# Patient Record
Sex: Male | Born: 1990 | Race: Black or African American | Hispanic: No | Marital: Married | Smoking: Current every day smoker
Health system: Southern US, Community
[De-identification: ages and names within clinical notes are randomized; demographics above are authoritative.]

## PROBLEM LIST (undated history)

## (undated) DIAGNOSIS — G4730 Sleep apnea, unspecified: Secondary | ICD-10-CM

## (undated) DIAGNOSIS — I10 Essential (primary) hypertension: Secondary | ICD-10-CM

## (undated) DIAGNOSIS — E669 Obesity, unspecified: Secondary | ICD-10-CM

## (undated) DIAGNOSIS — J45909 Unspecified asthma, uncomplicated: Secondary | ICD-10-CM

## (undated) HISTORY — DX: Essential (primary) hypertension: I10

## (undated) HISTORY — DX: Obesity, unspecified: E66.9

## (undated) HISTORY — DX: Sleep apnea, unspecified: G47.30

---
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
## 2016-04-25 ENCOUNTER — Emergency Department: Payer: No Typology Code available for payment source

## 2016-04-25 ENCOUNTER — Emergency Department
Admission: EM | Admit: 2016-04-25 | Discharge: 2016-04-25 | Disposition: A | Discharge status: Home / Self Care | Payer: No Typology Code available for payment source | Attending: Emergency Medicine | Admitting: Emergency Medicine

## 2016-04-25 DIAGNOSIS — S161XXA Strain of muscle, fascia and tendon at neck level, initial encounter: Secondary | ICD-10-CM | POA: Insufficient documentation

## 2016-04-25 DIAGNOSIS — Y999 Unspecified external cause status: Secondary | ICD-10-CM | POA: Insufficient documentation

## 2016-04-25 DIAGNOSIS — J45909 Unspecified asthma, uncomplicated: Secondary | ICD-10-CM | POA: Insufficient documentation

## 2016-04-25 DIAGNOSIS — Y9389 Activity, other specified: Secondary | ICD-10-CM | POA: Diagnosis not present

## 2016-04-25 DIAGNOSIS — Y92410 Unspecified street and highway as the place of occurrence of the external cause: Secondary | ICD-10-CM | POA: Insufficient documentation

## 2016-04-25 DIAGNOSIS — M542 Cervicalgia: Secondary | ICD-10-CM | POA: Diagnosis present

## 2016-04-25 DIAGNOSIS — F17210 Nicotine dependence, cigarettes, uncomplicated: Secondary | ICD-10-CM | POA: Insufficient documentation

## 2016-04-25 HISTORY — DX: Unspecified asthma, uncomplicated: J45.909

## 2016-04-25 MED ORDER — METHOCARBAMOL 750 MG PO TABS: 750.0000 mg | ORAL_TABLET | Freq: Four times a day (QID) | ORAL | Status: DC

## 2016-04-25 MED ORDER — IBUPROFEN 50 MG PO CHEW: 50.0000 mg | CHEWABLE_TABLET | Freq: Three times a day (TID) | ORAL | Status: AC | PRN

## 2016-04-25 MED ORDER — IBUPROFEN 600 MG PO TABS: 600.0000 mg | ORAL_TABLET | Freq: Three times a day (TID) | ORAL | Status: DC | PRN

## 2016-04-25 MED ORDER — TRAMADOL HCL 50 MG PO TABS: 50.0000 mg | ORAL_TABLET | Freq: Four times a day (QID) | ORAL | Status: AC | PRN
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
## 2016-04-25 NOTE — Discharge Instructions (Signed)

## 2018-01-13 DIAGNOSIS — J45909 Unspecified asthma, uncomplicated: Secondary | ICD-10-CM | POA: Insufficient documentation

## 2018-01-13 DIAGNOSIS — F17210 Nicotine dependence, cigarettes, uncomplicated: Secondary | ICD-10-CM | POA: Insufficient documentation

## 2018-01-13 DIAGNOSIS — M25531 Pain in right wrist: Secondary | ICD-10-CM | POA: Insufficient documentation

## 2018-01-13 DIAGNOSIS — M25532 Pain in left wrist: Secondary | ICD-10-CM | POA: Insufficient documentation
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
## 2018-01-14 ENCOUNTER — Emergency Department
Admission: EM | Admit: 2018-01-14 | Discharge: 2018-01-14 | Disposition: A | Discharge status: Home / Self Care | Payer: Self-pay | Attending: Emergency Medicine | Admitting: Emergency Medicine

## 2018-01-14 ENCOUNTER — Emergency Department: Payer: Self-pay

## 2018-01-14 DIAGNOSIS — M25531 Pain in right wrist: Secondary | ICD-10-CM

## 2018-01-14 DIAGNOSIS — M25532 Pain in left wrist: Secondary | ICD-10-CM

## 2018-01-14 NOTE — ED Notes (Signed)
Pt in bed, no complaints at this time.
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
## 2020-01-02 ENCOUNTER — Ambulatory Visit: Payer: Self-pay | Attending: Internal Medicine

## 2020-01-02 DIAGNOSIS — Z20822 Contact with and (suspected) exposure to covid-19: Secondary | ICD-10-CM | POA: Insufficient documentation

## 2020-01-03 LAB — SARS-COV-2, NAA 2 DAY TAT

## 2020-01-03 LAB — NOVEL CORONAVIRUS, NAA: SARS-CoV-2, NAA: NOT DETECTED

## 2020-02-25 ENCOUNTER — Ambulatory Visit
Admission: EM | Admit: 2020-02-25 | Discharge: 2020-02-25 | Disposition: A | Discharge status: Home / Self Care | Payer: Self-pay | Attending: Urgent Care | Admitting: Urgent Care

## 2020-02-25 ENCOUNTER — Other Ambulatory Visit: Payer: Self-pay

## 2020-02-25 ENCOUNTER — Encounter: Payer: Self-pay | Admitting: Emergency Medicine

## 2020-02-25 DIAGNOSIS — I10 Essential (primary) hypertension: Secondary | ICD-10-CM

## 2020-02-25 LAB — CBC
HCT: 42.2 % (ref 39.0–52.0)
Hemoglobin: 13.7 g/dL (ref 13.0–17.0)
MCH: 29 pg (ref 26.0–34.0)
MCHC: 32.5 g/dL (ref 30.0–36.0)
MCV: 89.4 fL (ref 80.0–100.0)
Platelets: 348 10*3/uL (ref 150–400)
RBC: 4.72 MIL/uL (ref 4.22–5.81)
RDW: 13.1 % (ref 11.5–15.5)
WBC: 5 10*3/uL (ref 4.0–10.5)
nRBC: 0 % (ref 0.0–0.2)

## 2020-02-25 LAB — BASIC METABOLIC PANEL
Anion gap: 8 (ref 5–15)
BUN: 12 mg/dL (ref 6–20)
CO2: 26 mmol/L (ref 22–32)
Calcium: 9 mg/dL (ref 8.9–10.3)
Chloride: 102 mmol/L (ref 98–111)
Creatinine, Ser: 0.85 mg/dL (ref 0.61–1.24)
GFR calc Af Amer: >60 mL/min (ref >60)
GFR calc non Af Amer: >60 mL/min (ref >60)
Glucose, Bld: 141 mg/dL — ABNORMAL HIGH (ref 70–99)
Potassium: 4.2 mmol/L (ref 3.5–5.1)
Sodium: 136 mmol/L (ref 135–145)

## 2020-02-25 MED ORDER — HYDROCHLOROTHIAZIDE 25 MG PO TABS: 25.0000 mg | ORAL_TABLET | Freq: Every day | ORAL | 0 refills | Status: AC

## 2020-02-25 NOTE — ED Triage Notes (Signed)
Patient states his BP has been elevated for about 3 weeks. He states he went to a physical appointment 2 times for his job and both times his BP has been elevated. He does not recall what the BP was.

## 2020-02-25 NOTE — ED Provider Notes (Signed)
[place], [place]   Name: Thomas Goodman DOB: 02/24/1991 MRN: [id number] CSN: [id number] PCP: Patient, No Pcp Per  Arrival date and time:  02/25/20 6812  Chief Complaint:  Hypertension  NOTE: Prior to seeing the patient today, I have reviewed the triage nursing documentation and vital signs. Clinical staff has updated patient's PMH/PSHx, current medication list, and drug allergies/intolerances to ensure comprehensive history available to assist in medical decision making.   History:   HPI: Thomas Goodman is a 29 y.o. male who presents today with complaints of his blood pressure being elevated. Patient is unsure how long this has been going on, as he has no associated symptoms. Patient reports that he is attempting to get a new job at [employer] and has "failed the physical" twice. He was advised to present to urgent care for evaluation of his blood pressure and to get started on antihypertensive therapy. Patient notes that he was first made aware of the fact that he was hypertensive about 3 weeks ago. He denies chest pain, shortness of breath, palpitations, peripheral edema, and visual changes. During his pre-employment physical his SBPs were running in the 180s. Patient advises that since he learned of his blood pressure being elevated he has been having "very mild headaches" citing the fact that he has been worried about it. Patient has no PCP; states, "I have to get the job first so I can get insurance to go to the doctor". Family history (+) for HTN.   Past Medical History:  Diagnosis Date  . Asthma     History reviewed. No pertinent surgical history.  History reviewed. No pertinent family history.  Social History   Tobacco Use  . Smoking status: Current Every Day Smoker    Types: Cigarettes  . Smokeless tobacco: Former Network engineer Use Topics  . Alcohol use: Yes  . Drug use: Never    There are no problems to display for this patient.   Home Medications:    No outpatient  medications have been marked as taking for the 02/25/20 encounter [facility] Encounter).    Allergies:   Patient has no known allergies.  Review of Systems (ROS):  Review of systems NEGATIVE unless otherwise noted in narrative H&P section.   Vital Signs: Today's Vitals   02/25/20 0857 02/25/20 0859 02/25/20 0955  BP:  (!) 153/106   Pulse:  91   Resp:  18   Temp:  98.8 F (37.1 C)   TempSrc:  Oral   SpO2:  98%   Weight: 265 lb (120.2 kg)    Height: 6' (1.829 m)    PainSc: 0-No pain  0-No pain    Physical Exam: Physical Exam  Constitutional: He is oriented to person, place, and time and well-developed, well-nourished, and in no distress.  HENT:  Head: Normocephalic and atraumatic.  Eyes: Pupils are equal, round, and reactive to light.  Cardiovascular: Normal rate, regular rhythm, normal heart sounds and intact distal pulses.  Pulmonary/Chest: Effort normal and breath sounds normal.  Musculoskeletal:     Cervical back: Normal range of motion and neck supple.  Neurological: He is alert and oriented to person, place, and time. He has normal sensation and intact cranial nerves. Gait normal.  Skin: Skin is warm and dry. No rash noted. He is not diaphoretic.  Psychiatric: Mood, memory, affect and judgment normal.  Nursing note and vitals reviewed.   Urgent Care Treatments / Results:   Orders Placed This Encounter  Procedures  . Basic metabolic  panel  . CBC    LABS: PLEASE NOTE: all labs that were ordered this encounter are listed, however only abnormal results are displayed. Labs Reviewed  BASIC METABOLIC PANEL - Abnormal; Notable for the following components:      Result Value   Glucose, Bld 141 (*)    All other components within normal limits  CBC    EKG: -None  RADIOLOGY: No results found.  PROCEDURES: Procedures  MEDICATIONS RECEIVED THIS VISIT: Medications - No data to display  PERTINENT CLINICAL COURSE NOTES/UPDATES:   Initial Impression /  Assessment and Plan / Urgent Care Course:  Pertinent labs & imaging results that were available during my care of the patient were personally reviewed by me and considered in my medical decision making (see lab/imaging section of note for values and interpretations).  Thomas Goodman is a 29 y.o. male who presents to [facility] today with complaints of Hypertension  Patient is well appearing overall in clinic today. He does not appear to be in any acute distress. Presenting symptoms (see HPI) and exam as documented above. Patient with HTN. No associated symptoms other than intermittent headaches. Basic labs (CBC and BMP) unremarkable. Starting patient on thiazide diuretic therapy (HCTZ 25 mg daily). Discussed lifestyle modifications including increased physical activity, weight loss, and reduced dietary Na intake. Information provided on DASH diet. Patient needs to obtain job with [employer] in order to secure medical insurance to be able to establish PCP services. 90 day supply of anti-HTN medication provided.   I have reviewed the follow up and strict return precautions for any new or worsening symptoms. Patient is aware of symptoms that would be deemed urgent/emergent, and would thus require further evaluation either here or in the emergency department. At the time of discharge, he verbalized understanding and consent with the discharge plan as it was reviewed with him. All questions were fielded by provider and/or clinic staff prior to patient discharge.    Final Clinical Impressions / Urgent Care Diagnoses:   Final diagnoses:  Essential hypertension    New Prescriptions:  [place] Controlled Substance Registry consulted? Not Applicable  Meds ordered this encounter  Medications  . hydrochlorothiazide (HYDRODIURIL) 25 MG tablet    Sig: Take 1 tablet (25 mg total) by mouth daily.    Dispense:  90 tablet    Refill:  0    Recommended Follow up Care:  Patient encouraged to follow up with the  following provider within the specified time frame, or sooner as dictated by the severity of his symptoms. As always, he was instructed that for any urgent/emergent care needs, he should seek care either here or in the emergency department for more immediate evaluation.  Follow-up Information    PCP.   Why: You MUST establish care witha primary care doctor. You need ongoing management of your health in general.        NOTE: This note was prepared using Dragon dictation software along with smaller phrase technology. Despite my best ability to proofread, there is the potential that transcriptional errors may still occur from this process, and are completely unintentional.    Verlee Monte, NP 02/25/20 1013

## 2020-02-25 NOTE — Discharge Instructions (Signed)
It was very nice seeing you today in clinic. Thank you for entrusting me with your care.   Labs looked normal today. Starting you on blood pressure medication today. It is going to make you urinate more, which is normal. Watch your dietary salt intake. Increase physical activity. It is absolutely necessary for your to establish care with a doctor for ongoing health maintenance. You are going to need additional labs and preventative care. You will likely need your medication dose increased to control your blood pressure.   If your symptoms/condition worsens, please seek follow up care either here or in the ER. Please remember, our [facility] providers are "right here with you" when you need [place].   Again, it was my pleasure to take care of you today. Thank you for choosing our clinic. I hope that you start to feel better quickly.   Quentin Mulling, MSN, APRN, FNP-C, CEN Advanced Practice Provider [facility]

## 2022-02-12 ENCOUNTER — Emergency Department: Payer: 59

## 2022-02-12 ENCOUNTER — Emergency Department
Admission: EM | Admit: 2022-02-12 | Discharge: 2022-02-12 | Disposition: A | Discharge status: Other Acute Inpatient Hospital | Payer: 59 | Attending: Emergency Medicine | Admitting: Emergency Medicine

## 2022-02-12 DIAGNOSIS — J45909 Unspecified asthma, uncomplicated: Secondary | ICD-10-CM | POA: Diagnosis not present

## 2022-02-12 DIAGNOSIS — S02602A Fracture of unspecified part of body of left mandible, initial encounter for closed fracture: Secondary | ICD-10-CM | POA: Diagnosis not present

## 2022-02-12 DIAGNOSIS — S0990XA Unspecified injury of head, initial encounter: Secondary | ICD-10-CM | POA: Diagnosis present

## 2022-02-12 DIAGNOSIS — S02609B Fracture of mandible, unspecified, initial encounter for open fracture: Secondary | ICD-10-CM

## 2022-02-12 LAB — COMPREHENSIVE METABOLIC PANEL
ALT: 36 U/L (ref 0–44)
AST: 33 U/L (ref 15–41)
Albumin: 4.5 g/dL (ref 3.5–5.0)
Alkaline Phosphatase: 54 U/L (ref 38–126)
Anion gap: 7 (ref 5–15)
BUN: 18 mg/dL (ref 6–20)
CO2: 27 mmol/L (ref 22–32)
Calcium: 8.9 mg/dL (ref 8.9–10.3)
Chloride: 105 mmol/L (ref 98–111)
Creatinine, Ser: 0.9 mg/dL (ref 0.61–1.24)
GFR, Estimated: >60 mL/min (ref >60)
Glucose, Bld: 136 mg/dL — ABNORMAL HIGH (ref 70–99)
Potassium: 3.6 mmol/L (ref 3.5–5.1)
Sodium: 139 mmol/L (ref 135–145)
Total Bilirubin: 0.9 mg/dL (ref 0.3–1.2)
Total Protein: 8.1 g/dL (ref 6.5–8.1)

## 2022-02-12 LAB — ETHANOL: Alcohol, Ethyl (B): 208 mg/dL — ABNORMAL HIGH (ref <10)

## 2022-02-12 LAB — CBC WITH DIFFERENTIAL/PLATELET
Abs Immature Granulocytes: 0.04 10*3/uL (ref 0.00–0.07)
Basophils Absolute: 0 10*3/uL (ref 0.0–0.1)
Basophils Relative: 0 %
Eosinophils Absolute: 0 10*3/uL (ref 0.0–0.5)
Eosinophils Relative: 0 %
HCT: 45 % (ref 39.0–52.0)
Hemoglobin: 14.2 g/dL (ref 13.0–17.0)
Immature Granulocytes: 0 %
Lymphocytes Relative: 9 %
Lymphs Abs: 1.2 10*3/uL (ref 0.7–4.0)
MCH: 28.4 pg (ref 26.0–34.0)
MCHC: 31.6 g/dL (ref 30.0–36.0)
MCV: 90 fL (ref 80.0–100.0)
Monocytes Absolute: 1 10*3/uL (ref 0.1–1.0)
Monocytes Relative: 8 %
Neutro Abs: 11.5 10*3/uL — ABNORMAL HIGH (ref 1.7–7.7)
Neutrophils Relative %: 83 %
Platelets: 371 10*3/uL (ref 150–400)
RBC: 5 MIL/uL (ref 4.22–5.81)
RDW: 13 % (ref 11.5–15.5)
WBC: 13.8 10*3/uL — ABNORMAL HIGH (ref 4.0–10.5)
nRBC: 0 % (ref 0.0–0.2)

## 2022-02-12 MED ORDER — ONDANSETRON HCL 4 MG/2ML IJ SOLN
4.0000 mg | Freq: Once | INTRAMUSCULAR | Status: AC
  Administered 2022-02-12: 4 mg via INTRAVENOUS
  Filled 2022-02-12: qty 2

## 2022-02-12 MED ORDER — MORPHINE SULFATE (PF) 4 MG/ML IV SOLN
4.0000 mg | Freq: Once | INTRAVENOUS | Status: AC
  Administered 2022-02-12: 4 mg via INTRAVENOUS
  Filled 2022-02-12: qty 1

## 2022-02-12 MED ORDER — HYDROMORPHONE HCL 1 MG/ML IJ SOLN
1.0000 mg | Freq: Once | INTRAMUSCULAR | Status: AC
  Administered 2022-02-12: 1 mg via INTRAVENOUS
  Filled 2022-02-12: qty 1
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
## 2022-02-12 NOTE — ED Notes (Signed)
This nurse has attempted to call report to [facility]. [facility] charge nurses states to call back in 10 mins ?

## 2022-03-08 DIAGNOSIS — S02601D Fracture of unspecified part of body of right mandible, subsequent encounter for fracture with routine healing: Secondary | ICD-10-CM | POA: Insufficient documentation

## 2022-12-30 ENCOUNTER — Other Ambulatory Visit: Payer: Self-pay

## 2022-12-30 ENCOUNTER — Emergency Department
Admission: EM | Admit: 2022-12-30 | Discharge: 2022-12-30 | Disposition: A | Discharge status: Home / Self Care | Payer: 59 | Attending: Emergency Medicine | Admitting: Emergency Medicine

## 2022-12-30 ENCOUNTER — Encounter: Payer: Self-pay | Admitting: Emergency Medicine

## 2022-12-30 DIAGNOSIS — I10 Essential (primary) hypertension: Secondary | ICD-10-CM | POA: Insufficient documentation

## 2022-12-30 DIAGNOSIS — M791 Myalgia, unspecified site: Secondary | ICD-10-CM | POA: Insufficient documentation

## 2022-12-30 DIAGNOSIS — Z008 Encounter for other general examination: Secondary | ICD-10-CM

## 2022-12-30 DIAGNOSIS — M7918 Myalgia, other site: Secondary | ICD-10-CM

## 2022-12-30 DIAGNOSIS — Z0183 Encounter for blood typing: Secondary | ICD-10-CM | POA: Insufficient documentation

## 2022-12-30 DIAGNOSIS — Z0189 Encounter for other specified special examinations: Secondary | ICD-10-CM

## 2022-12-30 MED ORDER — ACETAMINOPHEN 500 MG PO TABS
1000.0000 mg | ORAL_TABLET | Freq: Once | ORAL | Status: AC
  Administered 2022-12-30: 1000 mg via ORAL
  Filled 2022-12-30: qty 2
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
## 2022-12-30 NOTE — ED Provider Notes (Signed)
[facility] Provider Note    Event Date/Time   First MD Initiated Contact with Patient 12/30/22 [phone]     (approximate)   History   Labs Only   HPI  Thomas Goodman is a 32 y.o. male with a medical history that includes primary hypertension.  He is brought to the emergency department in the custody of the [facility].  The patient will not tell me anything because he states "I do not want to relive that experience".  The police officer explained that he was pulled over and they wanted to get an ethanol level but the patient refused the test, so they got a warrant and brought him here for forensic blood draw.  The patient said that his wrist hurt from the handcuffs and that they "almost pulled my head off".  He has full use of his arms and his legs and is in no apparent distress.     Physical Exam   Triage Vital Signs: ED Triage Vitals  Enc Vitals Group     BP 12/30/22 0449 (!) 170/100     Pulse Rate 12/30/22 0449 (!) 128     Resp 12/30/22 0449 20     Temp 12/30/22 0449 97.9 F (36.6 C)     Temp Source 12/30/22 0449 Oral     SpO2 12/30/22 0449 97 %     Weight 12/30/22 0446 120.2 kg (265 lb)     Height 12/30/22 0446 1.829 m (6')     Head Circumference --      Peak Flow --      Pain Score 12/30/22 0446 5     Pain Loc --      Pain Edu? --      Excl. in [place]? --     Most recent vital signs: Vitals:   12/30/22 0449  BP: (!) 170/100  Pulse: (!) 128  Resp: 20  Temp: 97.9 F (36.6 C)  SpO2: 97%     General: Awake, no obvious distress.  Communicative to some degree but not wanting to answer many medical questions. CV:  Good peripheral perfusion.  Tachycardia, normal heart sounds. Resp:  Normal effort. Speaking easily and comfortably, no accessory muscle usage nor intercostal retractions.  Lungs are clear to auscultation. Abd:  No distention.  Other:  Patient has some handcuff marks on both of his wrists but he has easily palpable  radial pulses, no bruising, no swelling, normal flexion extension of his wrist, no snuffbox tenderness on either hand, and no more proximal markings on his arms.  Similarly he has no evidence of strangulation or contusion to the front or back of his neck.  He has no tenderness to palpation of the cervical spine and is voluntarily flexing, extending, and rotating his head and neck from side-to-side without any apparent pain or difficulty.   ED Results / Procedures / Treatments   Labs (all labs ordered are listed, but only abnormal results are displayed) Labs Reviewed - No data to display    PROCEDURES:  Critical Care performed: No  Procedures   MEDICATIONS ORDERED IN ED: Medications  acetaminophen (TYLENOL) tablet 1,000 mg (1,000 mg Oral Given 12/30/22 0453)     IMPRESSION / MDM / [place] / ED COURSE  I reviewed the triage vital signs and the nursing notes.  Differential diagnosis includes, but is not limited to, intoxication, substance use, physical injury as result of police intervention.  Patient's presentation is most consistent with acute, uncomplicated illness.  Patient's vital signs are notable for hypertension (which is a known problem for him) and tachycardia, which is likely situational.  Although he has some markings of the handcuffs around his wrist, there is no indication he has any acute injury as a result other than simply the compression of the handcuffs around his wrist which have subsequently been removed.  No indication of neck or throat injury.  No sign of any emergent condition at this time requires further evaluation.  The patient is cleared for incarceration and forensic blood draw.  I ordered Tylenol 1000 mg p.o. which he took without any apparent difficulty.     FINAL CLINICAL IMPRESSION(S) / ED DIAGNOSES   Final diagnoses:  Musculoskeletal pain  Encounter for blood test  Medical clearance for incarceration      Rx / DC Orders   ED Discharge Orders     None        Note:  This document was prepared using Dragon voice recognition software and may include unintentional dictation errors.   Hinda Kehr, MD 12/30/22 [phone]

## 2022-12-30 NOTE — Discharge Instructions (Signed)
You are medically cleared for incarceration.  Please take ibuprofen (Motrin) and/or acetaminophen (Tylenol) as needed for pain according to label instructions.  Please consider following up with an outpatient doctor of your choosing in the resource guide.

## 2023-10-30 ENCOUNTER — Telehealth: Payer: Self-pay

## 2023-10-30 NOTE — Telephone Encounter (Signed)
Confirmed with patient no personal cardiac history.  Office location, directions, contact number provided.

## 2023-11-06 ENCOUNTER — Ambulatory Visit: Payer: 59 | Attending: Cardiology | Admitting: Cardiology

## 2023-12-01 NOTE — Progress Notes (Signed)
 Cardiology Office Note    Date:  12/04/2023   ID:  Thomas Goodman, DOB 1991-03-27, MRN [id number]  PCP:  Patient, No Pcp Per  Cardiologist:  New Electrophysiologist:  None   Chief Complaint: Chest pain  History of Present Illness:   Thomas Goodman is a 33 y.o. male with history of HTN, obesity, sleep disordered breathing, and intermittent tobacco and alcohol use who presents for evaluation of chest pain.  He does not have any previously known cardiac history.  He reports he was diagnosed with hypertension at age 8 and was prescribed hydrochlorothiazide.  He did not take the medication.  He reports some chest discomfort that occurred approximately 4 months prior after eating a large amount of [place] food at a buffet.  Chest discomfort was worse when laying on his right side and improved when laying on the left.  Chest discomfort was nonexertional.  At times there was some shortness of breath.  He also notes some intermittent sharp left-sided chest discomfort that will last for a split-second or 2 and spontaneously resolve, last having this on the evening of 12/03/2023.  This discomfort is nonexertional.  No palpitations, dizziness, presyncope, or syncope.  No lower extremity swelling or progressive orthopnea.  No early satiety.  Wife reports he snores and is apneic.  Previously, down to 230 pounds with regular exercise.  Plans on implementing lifestyle modification moving forward.  He indicates he will occasionally smoke a cigarette if he drinks alcohol socially.  Denies illicit substances.  He reports he had a cousin that died suddenly at age 10, details unclear.  At the same time he reports his father was diagnosed with heart failure.  Currently without symptoms of angina or cardiac decompensation.  Does not want pharmacotherapy for hypertension or referral for sleep study at this time.   Labs independently reviewed: 02/2022 - potassium 3.7, BUN 12, serum creatinine 0.73, magnesium 1.8, Hgb  12.6, PLT 321, albumin 4.5, AST/ALT normal  Past Medical History:  Diagnosis Date   Asthma    HTN (hypertension)    Obesity    Sleep disorder breathing     History reviewed. No pertinent surgical history.  Current Medications: No outpatient medications have been marked as taking for the 12/04/23 encounter (Office Visit) with Sondra Barges, PA-C.    Allergies:   Patient has no known allergies.   Social History   Socioeconomic History   Marital status: Single    Spouse name: Not on file   Number of children: Not on file   Years of education: Not on file   Highest education level: Not on file  Occupational History   Not on file  Tobacco Use   Smoking status: Every Day    Types: Cigarettes   Smokeless tobacco: Former  Advertising account planner   Vaping status: Never Used  Substance and Sexual Activity   Alcohol use: Yes   Drug use: Never   Sexual activity: Not on file  Other Topics Concern   Not on file  Social History Narrative   Not on file   Social Drivers of Health   Financial Resource Strain: Not on file  Food Insecurity: Not on file  Transportation Needs: Not on file  Physical Activity: Not on file  Stress: Not on file  Social Connections: Not on file     Family History:  The patient's family history includes Chronic Renal Failure in his paternal grandfather; Heart failure in his father and paternal grandmother; Hypertension in his  paternal grandmother; Kidney failure in his father.  ROS:   12-point review of systems is negative unless otherwise noted in the HPI.   EKGs/Labs/Other Studies Reviewed:    Studies reviewed were summarized above. The additional studies were reviewed today: None available for review.  EKG:  EKG is ordered today.  The EKG ordered today demonstrates NSR, 79 bpm, possible prior septal infarct versus lead placement, no acute ST-T changes  Recent Labs: No results found for requested labs within last 365 days.  Recent Lipid Panel No results  found for: "CHOL", "TRIG", "HDL", "CHOLHDL", "VLDL", "LDLCALC", "LDLDIRECT"  PHYSICAL EXAM:    VS:  BP (!) 161/94 (BP Location: Left Arm, Patient Position: Sitting)   Pulse 79   Ht 6\' 1"  (1.854 m)   Wt 286 lb 9.6 oz (130 kg)   SpO2 95%   BMI 37.81 kg/m   BMI: Body mass index is 37.81 kg/m.  Physical Exam Vitals reviewed.  Constitutional:      Appearance: He is well-developed.  HENT:     Head: Normocephalic and atraumatic.  Eyes:     General:        Right eye: No discharge.        Left eye: No discharge.  Cardiovascular:     Rate and Rhythm: Normal rate and regular rhythm.     Pulses:          Posterior tibial pulses are 2+ on the right side and 2+ on the left side.     Heart sounds: Normal heart sounds, S1 normal and S2 normal. Heart sounds not distant. No midsystolic click and no opening snap. No murmur heard.    No friction rub.  Pulmonary:     Effort: Pulmonary effort is normal. No respiratory distress.     Breath sounds: Normal breath sounds. No decreased breath sounds, wheezing, rhonchi or rales.  Chest:     Chest wall: No tenderness.  Musculoskeletal:     Cervical back: Normal range of motion.     Right lower leg: No edema.     Left lower leg: No edema.  Skin:    General: Skin is warm and dry.     Nails: There is no clubbing.  Neurological:     Mental Status: He is alert and oriented to person, place, and time.  Psychiatric:        Speech: Speech normal.        Behavior: Behavior normal.        Thought Content: Thought content normal.        Judgment: Judgment normal.     Wt Readings from Last 3 Encounters:  12/04/23 286 lb 9.6 oz (130 kg)  12/30/22 265 lb (120.2 kg)  02/25/20 265 lb (120.2 kg)     ASSESSMENT & PLAN:   Precordial pain: Symptoms appear to be consistent with likely reflux as they occurred after eating a large amount of [place] food on a buffet and was worse when laying on the right side.  Symptoms are nonexertional.  I do have concerns  with proceeding with GXT given baseline hypertension with declination of pharmacotherapy.  In this setting, we will also defer treadmill MPI as I do not think he would be able to exercise for long enough to have an adequate study with concern for hypertensive response.  Given young age will defer coronary CTA.  Pursue Lexiscan MPI to evaluate for high risk ischemia.  Obtain echo to evaluate for significant structural abnormalities.  Will benefit  from baseline labs for further risk stratification in follow-up.  HTN: Blood pressure is elevated in the office today at 161/94.  Declines pharmacotherapy.  Revisit in follow-up.  Low-sodium diet encouraged.  Obesity with sleep disordered breathing: Declines sleep study referral.  Weight loss is encouraged through heart healthy diet and regular exercise.   Informed Consent   Shared Decision Making/Informed Consent{  The risks [chest pain, shortness of breath, cardiac arrhythmias, dizziness, blood pressure fluctuations, myocardial infarction, stroke/transient ischemic attack, nausea, vomiting, allergic reaction, radiation exposure, metallic taste sensation and life-threatening complications (estimated to be 1 in 10,000)], benefits (risk stratification, diagnosing coronary artery disease, treatment guidance) and alternatives of a nuclear stress test were discussed in detail with Mr. Stenzel and he agrees to proceed.       Disposition: F/u with me 2 months.   Medication Adjustments/Labs and Tests Ordered: Current medicines are reviewed at length with the patient today.  Concerns regarding medicines are outlined above. Medication changes, Labs and Tests ordered today are summarized above and listed in the Patient Instructions accessible in Encounters.   Signed, Eula Listen, PA-C 12/04/2023 1:41 PM     [facility] [place] [address] [place], [place] [address] [phone]

## 2023-12-04 ENCOUNTER — Ambulatory Visit: Payer: 59 | Attending: Physician Assistant | Admitting: Physician Assistant

## 2023-12-04 ENCOUNTER — Encounter: Payer: Self-pay | Admitting: Physician Assistant

## 2023-12-04 VITALS — BP 161/94 | HR 79 | Ht 73.0 in | Wt 286.6 lb

## 2023-12-04 DIAGNOSIS — E66812 Obesity, class 2: Secondary | ICD-10-CM

## 2023-12-04 DIAGNOSIS — I10 Essential (primary) hypertension: Secondary | ICD-10-CM | POA: Diagnosis not present

## 2023-12-04 DIAGNOSIS — Z6837 Body mass index (BMI) 37.0-37.9, adult: Secondary | ICD-10-CM

## 2023-12-04 DIAGNOSIS — G4730 Sleep apnea, unspecified: Secondary | ICD-10-CM

## 2023-12-04 DIAGNOSIS — R072 Precordial pain: Secondary | ICD-10-CM | POA: Diagnosis not present

## 2023-12-04 DIAGNOSIS — R079 Chest pain, unspecified: Secondary | ICD-10-CM

## 2023-12-04 NOTE — Patient Instructions (Addendum)
 Medication Instructions:  Your Physician recommend you continue on your current medication as directed.    *If you need a refill on your cardiac medications before your next appointment, please call your pharmacy*   Lab Work: None ordered at this time  If you have labs (blood work) drawn today and your tests are completely normal, you will receive your results only by: MyChart Message (if you have MyChart) OR A paper copy in the mail If you have any lab test that is abnormal or we need to change your treatment, we will call you to review the results.   Testing/Procedures: Your provider has ordered a Lexiscan/ Exercise Myoview Stress test. This will take place at [facility]. Please report to the [facility] medical mall entrance. The volunteers at the first desk will direct you where to go.  [facility] MYOVIEW  Your provider has ordered a Stress Test with nuclear imaging. The purpose of this test is to evaluate the blood supply to your heart muscle. This procedure is referred to as a "Non-Invasive Stress Test." This is because other than having an IV started in your vein, nothing is inserted or "invades" your body. Cardiac stress tests are done to find areas of poor blood flow to the heart by determining the extent of coronary artery disease (CAD). Some patients exercise on a treadmill, which naturally increases the blood flow to your heart, while others who are unable to walk on a treadmill due to physical limitations will have a pharmacologic/chemical stress agent called Lexiscan . This medicine will mimic walking on a treadmill by temporarily increasing your coronary blood flow.   Please note: these test may take anywhere between 2-4 hours to complete  How to prepare for your Myoview test:  Nothing to eat for 6 hours prior to the test No caffeine for 24 hours prior to test No smoking 24 hours prior to test. Your medication may be taken with water.  If your doctor stopped a medication because of this test,  do not take that medication. No perfume, cologne or lotion. Wear comfortable walking shoes. No heels!   PLEASE NOTIFY THE OFFICE AT LEAST 24 HOURS IN ADVANCE IF YOU ARE UNABLE TO KEEP YOUR APPOINTMENT.  [phone] AND  PLEASE NOTIFY NUCLEAR MEDICINE AT [facility] AT LEAST 24 HOURS IN ADVANCE IF YOU ARE UNABLE TO KEEP YOUR APPOINTMENT. [phone]   Your physician has requested that you have an echocardiogram. Echocardiography is a painless test that uses sound waves to create images of your heart. It provides your doctor with information about the size and shape of your heart and how well your heart's chambers and valves are working.   You may receive an ultrasound enhancing agent through an IV if needed to better visualize your heart during the echo. This procedure takes approximately one hour.  There are no restrictions for this procedure.  This will take place at [address] [facility] [address], [place] [address]   Follow-Up: At [facility], you and your health needs are our priority.  As part of our continuing mission to provide you with exceptional heart care, we have created designated Provider Care Teams.  These Care Teams include your primary Cardiologist (physician) and Advanced Practice Providers (APPs -  Physician Assistants and Nurse Practitioners) who all work together to provide you with the care you need, when you need it.  We recommend signing up for the patient portal called "MyChart".  Sign up information is provided on this After Visit  Summary.  MyChart is used to connect with patients for Virtual Visits (Telemedicine).  Patients are able to view lab/test results, encounter notes, upcoming appointments, etc.  Non-urgent messages can be sent to your provider as well.   To learn more about what you can do with MyChart, go to [email or web address].    Your next appointment:   2 month(s)  Provider:   You may see Eula Listen, PA-C

## 2023-12-14 ENCOUNTER — Ambulatory Visit
Admission: RE | Admit: 2023-12-14 | Discharge: 2023-12-14 | Disposition: A | Discharge status: Home / Self Care | Payer: 59 | Source: Ambulatory Visit | Attending: Physician Assistant | Admitting: Physician Assistant

## 2023-12-14 DIAGNOSIS — R079 Chest pain, unspecified: Secondary | ICD-10-CM | POA: Insufficient documentation

## 2023-12-14 LAB — NM MYOCAR MULTI W/SPECT W/WALL MOTION / EF
LV dias vol: 154 mL (ref 62–150)
LV sys vol: 72 mL
MPHR: 186 {beats}/min
Nuc Stress EF: 53 %
Peak HR: 113 {beats}/min
Percent HR: 60 %
Rest HR: 77 {beats}/min
Rest Nuclear Isotope Dose: 11 mCi
SDS: 0
SRS: 1
SSS: 0
Stress Nuclear Isotope Dose: 32.4 mCi
TID: 0.89

## 2023-12-14 MED ORDER — TECHNETIUM TC 99M TETROFOSMIN IV KIT
10.0000 | PACK | Freq: Once | INTRAVENOUS | Status: AC | PRN
  Administered 2023-12-14: 11 via INTRAVENOUS

## 2023-12-14 MED ORDER — REGADENOSON 0.4 MG/5ML IV SOLN
0.4000 mg | Freq: Once | INTRAVENOUS | Status: AC
  Administered 2023-12-14: 0.4 mg via INTRAVENOUS

## 2023-12-14 MED ORDER — TECHNETIUM TC 99M TETROFOSMIN IV KIT
32.3600 | PACK | Freq: Once | INTRAVENOUS | Status: AC | PRN
  Administered 2023-12-14: 32.36 via INTRAVENOUS
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
## 2024-01-02 ENCOUNTER — Ambulatory Visit: Payer: 59 | Attending: Cardiovascular Disease

## 2024-01-02 DIAGNOSIS — R079 Chest pain, unspecified: Secondary | ICD-10-CM

## 2024-01-03 LAB — ECHOCARDIOGRAM COMPLETE
AR max vel: 3.17 cm2
AV Area VTI: 3.24 cm2
AV Area mean vel: 2.98 cm2
AV Mean grad: 4 mmHg
AV Peak grad: 8.4 mmHg
Ao pk vel: 1.45 m/s
Area-P 1/2: 4.68 cm2
S' Lateral: 4.1 cm

## 2024-02-05 NOTE — Progress Notes (Deleted)
 Cardiology Office Note    Date:  02/05/2024   ID:  Thomas Goodman, DOB [birth year], MRN [id number]  PCP:  Patient, No Pcp Per  Cardiologist:  None  Electrophysiologist:  None   Chief Complaint: Follow-up  History of Present Illness:   Thomas Goodman is a 33 y.o. male with history of HTN, obesity, and sleep disordered breathing who presents for follow-up of echo and Lexiscan  MPI.  He was evaluated as a new patient in 11/2023 for chest pain.  He reported he was diagnosed with hypertension at age 68 and was previously prescribed hydrochlorothiazide .  He was not taking any pharmacotherapy for hypertension at time of his evaluation in February.  He reported chest discomfort that had occurred approximately 4 times after eating a large amount of [place] food at a buffet.  Discomfort was nonexertional.  He also reported some intermittent left-sided sharp chest discomfort that would last for a split-second or 2 and spontaneously resolved.  His wife reported that he snored and had apneic episodes.  Blood pressure was poorly controlled at 161/94.  He declined pharmacotherapy for hypertension as well as referral for sleep study.  Lexiscan  MPI in 12/2023 showed no significant ischemia with an EF of 60%.  CT attenuation correction imaging showed no significant coronary artery calcification or aortic atherosclerosis.  Overall, this was a low risk scan.  Echo in 12/2023 showed an EF of 60 to 65%, no regional wall motion abnormalities, mild LVH, normal LV diastolic function parameters, normal RV systolic function and ventricular cavity size, no significant valvular abnormalities, and an estimated right atrial pressure of 3 mmHg.  ***   Labs independently reviewed: 02/2022 - potassium 3.7, BUN 12, serum creatinine 0.73, magnesium 1.8, Hgb 12.6, PLT 321, albumin 4.5, AST/ALT normal   Past Medical History:  Diagnosis Date   Asthma    HTN (hypertension)    Obesity    Sleep disorder breathing     No past  surgical history on file.  Current Medications: No outpatient medications have been marked as taking for the 02/06/24 encounter (Appointment) with Roark Chick, PA-C.    Allergies:   Patient has no known allergies.   Social History   Socioeconomic History   Marital status: Married    Spouse name: Not on file   Number of children: Not on file   Years of education: Not on file   Highest education level: Not on file  Occupational History   Not on file  Tobacco Use   Smoking status: Every Day    Types: Cigarettes   Smokeless tobacco: Former  Advertising account planner   Vaping status: Never Used  Substance and Sexual Activity   Alcohol use: Yes   Drug use: Never   Sexual activity: Not on file  Other Topics Concern   Not on file  Social History Narrative   Not on file   Social Drivers of Health   Financial Resource Strain: Not on file  Food Insecurity: Not on file  Transportation Needs: Not on file  Physical Activity: Not on file  Stress: Not on file  Social Connections: Not on file     Family History:  The patient's family history includes Chronic Renal Failure in his paternal grandfather; Heart failure in his father and paternal grandmother; Hypertension in his paternal grandmother; Kidney failure in his father.  ROS:   12-point review of systems is negative unless otherwise noted in the HPI.   EKGs/Labs/Other Studies Reviewed:    Studies reviewed  were summarized above. The additional studies were reviewed today:  Lexiscan  MPI 12/14/2023: Pharmacological myocardial perfusion imaging study with no significant  ischemia Normal wall motion, EF estimated at 60%, GI uptake artifact noted No EKG changes concerning for ischemia at peak stress or in recovery. CT attenuation correction images with no significant coronary calcification, no aortic atherosclerosis Low risk scan __________  2D echo 01/02/2024: 1. Left ventricular ejection fraction, by estimation, is 60 to 65%. Left   ventricular ejection fraction by 3D volume is 65 %. The left ventricle has  normal function. The left ventricle has no regional wall motion  abnormalities. There is mild left  ventricular hypertrophy. Left ventricular diastolic parameters were  normal. The average left ventricular global longitudinal strain is -17.2  %. The global longitudinal strain is normal.   2. Right ventricular systolic function is normal. The right ventricular  size is normal.   3. The mitral valve is normal in structure. No evidence of mitral valve  regurgitation. No evidence of mitral stenosis.   4. The aortic valve is normal in structure. Aortic valve regurgitation is  not visualized. No aortic stenosis is present.   5. The inferior vena cava is normal in size with greater than 50%  respiratory variability, suggesting right atrial pressure of 3 mmHg.    EKG:  EKG is ordered today.  The EKG ordered today demonstrates ***  Recent Labs: No results found for requested labs within last 365 days.  Recent Lipid Panel No results found for: "CHOL", "TRIG", "HDL", "CHOLHDL", "VLDL", "LDLCALC", "LDLDIRECT"  PHYSICAL EXAM:    VS:  There were no vitals taken for this visit.  BMI: There is no height or weight on file to calculate BMI.  Physical Exam  Wt Readings from Last 3 Encounters:  12/04/23 286 lb 9.6 oz (130 kg)  12/30/22 265 lb (120.2 kg)  02/25/20 265 lb (120.2 kg)     ASSESSMENT & PLAN:   Precordial pain:  HTN: Blood pressure  Obesity with sleep disordered breathing:   {Are you ordering a CV Procedure (e.g. stress test, cath, DCCV, TEE, etc)?   Press F2        :[id number]}     Disposition: F/u with Dr. Aaron Aas or an APP in ***.   Medication Adjustments/Labs and Tests Ordered: Current medicines are reviewed at length with the patient today.  Concerns regarding medicines are outlined above. Medication changes, Labs and Tests ordered today are summarized above and listed in the Patient Instructions  accessible in Encounters.   Signed, Varney Gentleman, PA-C 02/05/2024 7:58 AM     [facility] [place] [address] [place], [place] [address] [phone]

## 2024-02-06 ENCOUNTER — Ambulatory Visit: Payer: 59 | Attending: Physician Assistant | Admitting: Physician Assistant
